# Patient Record
Sex: Female | Born: 1965 | Race: Black or African American | Hispanic: No | Smoking: Never smoker
Health system: Southern US, Community
[De-identification: ages and names within clinical notes are randomized; demographics above are authoritative.]

## PROBLEM LIST (undated history)

## (undated) DIAGNOSIS — I10 Essential (primary) hypertension: Secondary | ICD-10-CM

## (undated) DIAGNOSIS — M1990 Unspecified osteoarthritis, unspecified site: Secondary | ICD-10-CM

## (undated) DIAGNOSIS — M797 Fibromyalgia: Secondary | ICD-10-CM

---

## 2016-02-14 ENCOUNTER — Other Ambulatory Visit: Payer: Self-pay | Admitting: Occupational Medicine

## 2016-02-14 ENCOUNTER — Ambulatory Visit: Payer: Self-pay

## 2016-02-14 DIAGNOSIS — Z0000 Encounter for general adult medical examination without abnormal findings: Secondary | ICD-10-CM

## 2017-03-19 ENCOUNTER — Emergency Department (HOSPITAL_COMMUNITY)
Admission: EM | Admit: 2017-03-19 | Discharge: 2017-03-19 | Disposition: A | Discharge status: Home / Self Care | Payer: 59 | Attending: Emergency Medicine | Admitting: Emergency Medicine

## 2017-03-19 ENCOUNTER — Emergency Department (HOSPITAL_COMMUNITY): Payer: 59

## 2017-03-19 DIAGNOSIS — M79604 Pain in right leg: Secondary | ICD-10-CM

## 2017-03-19 DIAGNOSIS — M25551 Pain in right hip: Secondary | ICD-10-CM | POA: Diagnosis present

## 2017-03-19 DIAGNOSIS — M79661 Pain in right lower leg: Secondary | ICD-10-CM | POA: Insufficient documentation

## 2017-03-19 MED ORDER — DICLOFENAC SODIUM 1 % TD GEL: 2.0000 g | Freq: Four times a day (QID) | TRANSDERMAL | 0 refills | Status: AC

## 2017-03-19 MED ORDER — KETOROLAC TROMETHAMINE 30 MG/ML IJ SOLN
15.0000 mg | Freq: Once | INTRAMUSCULAR | Status: AC
  Administered 2017-03-19: 15 mg via INTRAMUSCULAR
  Filled 2017-03-19: qty 1

## 2017-03-19 NOTE — ED Provider Notes (Signed)
[facility]   CSN: [id number] Arrival date & time: 03/19/17  1246     History   Chief Complaint Chief Complaint  Patient presents with  . Leg Pain    HPI Latoya Payne is a 52 y.o. female.  Latoya Payne is a 52 y.o. Female with a history fibromyalgia and rheumatoid arthritis, who presents to the ED for evaluation of pain that starts in her right hip and radiates down through the right leg.  Patient reports for the most part the pain remains concentrated in her upper thigh around her hip that she occasionally gets sharp pains that shoot down her leg.  Patient denies any inciting injury.  No swelling, erythema or warmth.  She is more BV the ED patient patient denies any numbness, weakness or tingling.  Patient has been ambulatory although reports of been painful.  Patient reports this pain has been present for the past 2 weeks but was especially bad when she woke up this morning.  Patient denies any associated fevers or chills.  No associated back pain, abdominal pain, dysuria, chest pain or shortness of breath.  She takes oxycodone on a regular basis for fibromyalgia and this does not help this pain, patient has not tried any other medications or treatments for pain.  Movement makes pain worse no other aggravating factors.      No past medical history on file.  There are no active problems to display for this patient.    OB History    No data available       Home Medications    Prior to Admission medications   Medication Sig Start Date End Date Taking? Authorizing Provider  diclofenac sodium (VOLTAREN) 1 % GEL Apply 2 g topically 4 (four) times daily. 03/19/17   Dartha LodgeFord, Dunia Pringle N, PA-C    Family History No family history on file.  Social History Social History   Tobacco Use  . Smoking status: Not on file  Substance Use Topics  . Alcohol use: Not on file  . Drug use: Not on file     Allergies   Patient has no  known allergies.   Review of Systems Review of Systems  Constitutional: Negative for chills and fever.  Respiratory: Negative for shortness of breath.   Cardiovascular: Negative for chest pain.  Gastrointestinal: Negative for abdominal pain.  Genitourinary: Negative for dysuria.  Musculoskeletal: Positive for myalgias. Negative for arthralgias, back pain, gait problem and joint swelling.  Skin: Negative for color change, rash and wound.  Neurological: Negative for weakness and numbness.     Physical Exam Updated Vital Signs BP (!) 172/101 (BP Location: Right Arm)   Pulse 89   Temp 98.4 F (36.9 C) (Oral)   Resp 18   LMP 03/18/2017 (Exact Date)   SpO2 99%   Physical Exam  Constitutional: She appears well-developed and well-nourished. No distress.  HENT:  Head: Normocephalic and atraumatic.  Eyes: Right eye exhibits no discharge. Left eye exhibits no discharge.  Cardiovascular: Normal rate and regular rhythm.  Pulses:      Radial pulses are 2+ on the right side, and 2+ on the left side.       Dorsalis pedis pulses are 2+ on the right side, and 2+ on the left side.       Posterior tibial pulses are 2+ on the right side, and 2+ on the left side.  Pulmonary/Chest: Effort normal and breath sounds normal. No respiratory distress.  Abdominal:  Soft. Bowel sounds are normal. She exhibits no distension. There is no tenderness.  Musculoskeletal:  L-Spine nontender to palpation at midline or paraspinally, no tenderness over the lower back There is some tenderness to palpation over the left hip, no erythema, warmth, swelling or palpable deformity, tenderness is most pronounced over the lateral and anterior aspect mild tenderness through the upper leg, no tenderness at the knee, ankle or foot, no appreciable swelling DP and TP pulses 2+ with good capillary refill, sensation intact, 5/5 dorsi and plantar flexion, normal strength of proximal leg muscles  Neurological: She is alert.  Coordination normal.  Skin: Skin is warm and dry. Capillary refill takes less than 2 seconds. She is not diaphoretic.  Psychiatric: She has a normal mood and affect. Her behavior is normal.  Nursing note and vitals reviewed.    ED Treatments / Results  Labs (all labs ordered are listed, but only abnormal results are displayed) Labs Reviewed - No data to display  EKG  EKG Interpretation None       Radiology Dg Hip Unilat W Or Wo Pelvis 2-3 Views Right  Result Date: 03/19/2017 CLINICAL DATA:  Right hip pain.  No known injury. EXAM: DG HIP (WITH OR WITHOUT PELVIS) 2-3V RIGHT COMPARISON:  None. FINDINGS: There is no evidence of hip fracture or dislocation. There is no evidence of arthropathy or other focal bone abnormality. IMPRESSION: Negative. Electronically Signed   By: Signa Kell M.D.   On: 03/19/2017 15:03    Procedures Procedures (including critical care time)  Medications Ordered in ED Medications  ketorolac (TORADOL) 30 MG/ML injection 15 mg (15 mg Intramuscular Given 03/19/17 1439)     Initial Impression / Assessment and Plan / ED Course  I have reviewed the triage vital signs and the nursing notes.  Pertinent labs & imaging results that were available during my care of the patient were reviewed by me and considered in my medical decision making (see chart for details).  Pt with history of fibromyalgia and RA presents for evaluation of right leg pain, no swelling, deformity, erythema or warmth.  No fevers, back pain, abdominal pain or dysuria.  Patient is hypertensive but vitals otherwise normal, no neurologic exam right lower extremity is neurovascularly intact.  X-ray of right hip shows no acute bony abnormalities, normal joint spaces, no effusion.  Presentation suggestive of bursitis versus sciatica.  Patient already on chronic pain medication for her fibromyalgia, will treat with Tylenol, Voltaren gel and crutches.  If symptoms are not improving in the next week  patient follow-up with her PCP as well as orthopedics.  Strict return precautions discussed.  Patient expresses understanding and is in plan.  Final Clinical Impressions(s) / ED Diagnoses   Final diagnoses:  Pain of right lower extremity  Right hip pain    ED Discharge Orders        Ordered    diclofenac sodium (VOLTAREN) 1 % GEL  4 times daily     03/19/17 1533       Legrand Rams 03/19/17 Gillis Santa, MD 03/20/17 [phone]

## 2017-03-19 NOTE — ED Notes (Signed)
Pt called out for pain medication. EDP to be notified.

## 2017-03-19 NOTE — Discharge Instructions (Signed)
Your pain could be caused by bursitis or sciatica.  You may treat pain with Tylenol and diclofenac gel in addition to your home pain medications.  Can also try Salonpas, which is over the counter. you may use use crutches, encourage you to alternate ice and heat over the area to help with pain.  These follow-up with your primary doctor and if symptoms are not improving in a week follow-up with Dr. August Saucerean with orthopedics.  If you have weakness, numbness or tingling in the leg, or unable to walk or develop redness, swelling or severely increased pain please return to the ED for reevaluation.

## 2017-03-19 NOTE — ED Triage Notes (Signed)
Pt reports right side leg pain from hip into right leg. Pt denies any injury. No swelling. ambulatory in triage.

## 2017-03-26 ENCOUNTER — Emergency Department (HOSPITAL_BASED_OUTPATIENT_CLINIC_OR_DEPARTMENT_OTHER): Admit: 2017-03-26 | Discharge: 2017-03-26 | Disposition: A | Discharge status: Home / Self Care | Payer: 59

## 2017-03-26 ENCOUNTER — Encounter (HOSPITAL_COMMUNITY): Payer: Self-pay

## 2017-03-26 ENCOUNTER — Emergency Department (HOSPITAL_COMMUNITY)
Admission: EM | Admit: 2017-03-26 | Discharge: 2017-03-26 | Disposition: A | Discharge status: Home / Self Care | Payer: 59 | Attending: Emergency Medicine | Admitting: Emergency Medicine

## 2017-03-26 ENCOUNTER — Other Ambulatory Visit: Payer: Self-pay

## 2017-03-26 DIAGNOSIS — M79609 Pain in unspecified limb: Secondary | ICD-10-CM | POA: Diagnosis not present

## 2017-03-26 DIAGNOSIS — M79604 Pain in right leg: Secondary | ICD-10-CM | POA: Diagnosis present

## 2017-03-26 DIAGNOSIS — Z79899 Other long term (current) drug therapy: Secondary | ICD-10-CM | POA: Diagnosis not present

## 2017-03-26 DIAGNOSIS — I10 Essential (primary) hypertension: Secondary | ICD-10-CM | POA: Insufficient documentation

## 2017-03-26 HISTORY — DX: Essential (primary) hypertension: I10

## 2017-03-26 HISTORY — DX: Fibromyalgia: M79.7

## 2017-03-26 HISTORY — DX: Unspecified osteoarthritis, unspecified site: M19.90

## 2017-03-26 LAB — URINALYSIS, ROUTINE W REFLEX MICROSCOPIC
BILIRUBIN URINE: NEGATIVE
Glucose, UA: NEGATIVE mg/dL
HGB URINE DIPSTICK: NEGATIVE
KETONES UR: NEGATIVE mg/dL
Leukocytes, UA: NEGATIVE
NITRITE: NEGATIVE
PH: 6 (ref 5.0–8.0)
Protein, ur: NEGATIVE mg/dL
Specific Gravity, Urine: 1.019 (ref 1.005–1.030)

## 2017-03-26 MED ORDER — METHOCARBAMOL 500 MG PO TABS: 500.0000 mg | ORAL_TABLET | Freq: Two times a day (BID) | ORAL | 0 refills | Status: DC | PRN

## 2017-03-26 MED ORDER — CYCLOBENZAPRINE HCL 5 MG PO TABS: 5.0000 mg | ORAL_TABLET | Freq: Two times a day (BID) | ORAL | 0 refills | Status: DC | PRN

## 2017-03-26 MED ORDER — KETOROLAC TROMETHAMINE 15 MG/ML IJ SOLN
15.0000 mg | Freq: Once | INTRAMUSCULAR | Status: AC
  Administered 2017-03-26: 15 mg via INTRAMUSCULAR
  Filled 2017-03-26: qty 1

## 2017-03-26 NOTE — ED Triage Notes (Signed)
Patient seen last week for right leg pain. States that her xray was fine and treated for bursitis. States that her pain is ongoing. Alert and oriented, NAD

## 2017-03-26 NOTE — ED Notes (Signed)
EDP states okay for patient to drink - provided ginger ale so patient may provide urine sample for UA.

## 2017-03-26 NOTE — ED Notes (Signed)
Patient verbalized understanding of discharge instructions and denies any further needs or questions at this time. VS stable. Patient ambulatory with steady gait.  

## 2017-03-26 NOTE — ED Provider Notes (Signed)
[facility] Provider Note   CSN: [id number] Arrival date & time: 03/26/17  1536     History   Chief Complaint Chief Complaint  Patient presents with  . recheck leg pain    HPI Latoya Payne is a 52 y.o. female presenting for evaluation of R leg pain.   Pt states for the past several weeks she had gradual onset R leg pain. She was initially dx with bursitis. She followed up with ortho, who recommended she go to her rheumatologist, as this may be an arthritis flare. She reports pain is of her entire R upper leg, starting and her groin and extending just beyond her knee. It is sharp with movement, throbs when still. Hurts worse when she opens her leg. She has been using Voltaren gel with minimal relief of pain, oxycodone does not help. She is unable to take prednisone due to allergy. She denies fall, trauma, or injury. She denies back pain, numbness, tingling, or loss of bowel or bladder control. 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 She denies fevers, chills, CP, SOB, abd pain, N/V, urinary sxs, or abnormal BMs. 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 No recent travel, surgery, h/o cancer, or OCP use.   HPI  Past Medical History:  Diagnosis Date  . Arthritis   . Fibromyalgia   . Hypertension     There are no active problems to display for this patient.   History reviewed. No pertinent surgical history.  OB History    No data available       Home Medications    Prior to Admission medications   Medication Sig Start Date End Date Taking? Authorizing Provider  amLODipine (NORVASC) 10 MG tablet Take 10 mg by mouth daily. 03/20/17  Yes [provider]  diclofenac sodium (VOLTAREN) 1 % GEL Apply 2 g topically 4 (four) times daily. 03/19/17  Yes Dartha LodgeFord, Kelsey N, PA-C  gabapentin (NEURONTIN) 800 MG tablet Take 800 mg by mouth 3 (three) times daily. 03/21/17  Yes [provider]  meloxicam (MOBIC) 15 MG tablet Take 15 mg by mouth daily. 03/04/17  Yes [provider]  oxyCODONE-acetaminophen (PERCOCET) 10-325 MG tablet Take 1 tablet by mouth 5 (five) times daily as needed for pain. 03/26/17  Yes [provider]  methocarbamol (ROBAXIN) 500 MG tablet Take 1 tablet (500 mg total) by mouth 2 (two) times daily as needed for muscle spasms. 03/26/17   Katherinne Mofield, PA-C    Family History No family history on file.  Social History Social History   Tobacco Use  . Smoking status: Never Smoker  . Smokeless tobacco: Never Used  Substance Use Topics  . Alcohol use: Not on file  . Drug use: Not on file     Allergies   Prednisone; Singulair [montelukast]; Duloxetine; Flexeril [cyclobenzaprine]; and Lisinopril   Review of Systems Review of Systems  Musculoskeletal: Positive for arthralgias and myalgias.  All other systems reviewed and are negative.    Physical Exam Updated Vital Signs BP (!) 160/88 (BP Location: Right Arm)   Pulse 78   Temp 98.7 F (37.1 C) (Oral)   Resp 16   Ht 5\' 7"  (1.702 m)   Wt 80.7 kg (178 lb)   LMP 03/18/2017 (Exact Date)   SpO2 99%   BMI 27.88 kg/m   Physical Exam  Constitutional: She is oriented to person, place,  and time. She appears well-developed and well-nourished. No distress.  HENT:  Head: Normocephalic and atraumatic.  Eyes: Conjunctivae and EOM are normal. Pupils are equal, round, and reactive to light.  Neck: Normal range of motion. Neck supple.  Cardiovascular: Normal rate, regular rhythm and intact distal pulses.  Pulmonary/Chest: Effort normal and breath sounds normal. No respiratory distress. She has no wheezes.  Abdominal: Soft. She exhibits no distension and no mass. There is no tenderness. There is no guarding.  Musculoskeletal: She exhibits tenderness.  TTP of anterior R hip and R upper leg, anterior and posterior. No obvious swelling, redness, or warmth. TTP of R suprapubic area. No TTP  on L side. Soft compartments. Pt is ambulatory. Pedal and popliteal pulses intact bilaterally. Sensation intact bilaterally. No TTP of spine  Neurological: She is alert and oriented to person, place, and time. She has normal strength and normal reflexes. Coordination and gait normal.  Skin: Skin is warm and dry.  Psychiatric: She has a normal mood and affect.  Nursing note and vitals reviewed.    ED Treatments / Results  Labs (all labs ordered are listed, but only abnormal results are displayed) Labs Reviewed  URINALYSIS, ROUTINE W REFLEX MICROSCOPIC    EKG  EKG Interpretation None       Radiology No results found.  Procedures Procedures (including critical care time)  Medications Ordered in ED Medications  ketorolac (TORADOL) 15 MG/ML injection 15 mg (15 mg Intramuscular Given 03/26/17 1925)     Initial Impression / Assessment and Plan / ED Course  I have reviewed the triage vital signs and the nursing notes.  Pertinent labs & imaging results that were available during my care of the patient were reviewed by me and considered in my medical decision making (see chart for details).     Pt presenting for evaluation of right leg pain.  Physical exam reassuring, she is neurovascularly intact.  As patient has history of DVT, will obtain ultrasound to ensure leg pain is not due to DVT.  Will obtain UA, as patient has right-sided suprapubic tenderness.  Patient given ketorolac shot for pain.  Ultrasound negative for DVT.  Urine negative for infection or blood.  Doubt abdominal etiology, as pain extends into the leg.  On reassessment, pain is improved with ketorolac.  Blood pressure improved with ketorolac.  I believe hypertension may be related to pain.  Likely MSK.  As patient has had recent x-rays with orthopedics of the hip and knee, I will not repeat those today.  No sign of infection.  Will start patient on anti-inflammatories and muscle relaxer.  Patient to follow-up with  primary care or rheumatology for further evaluation.  At this time, patient appears safe for discharge.  Return precautions given.  Patient states she understands and agrees to plan.   Final Clinical Impressions(s) / ED Diagnoses   Final diagnoses:  Right leg pain    ED Discharge Orders        Ordered    cyclobenzaprine (FLEXERIL) 5 MG tablet  2 times daily PRN,   Status:  Discontinued     03/26/17 2115    methocarbamol (ROBAXIN) 500 MG tablet  2 times daily PRN     03/26/17 2128       Maximino Cozzolino, PA-C 03/27/17 0113    Alveria Apley, PA-C 03/27/17 0114    Linwood Dibbles, MD 03/27/17 [phone]

## 2017-03-26 NOTE — Progress Notes (Signed)
RLE venous duplex prelim: negative for DVT.  Telicia Hodgkiss Eunice, RDMS, RVT  

## 2017-03-26 NOTE — Discharge Instructions (Signed)
Take aleve 2 times a day with meals.  Do not take other anti-inflammatories at the same time open (Advil, Motrin, ibuprofen, Aleve).  Continue taking your at home medications including oxycodone and the voltaren gel.  Use the muscle relaxer for pain.  Use ice packs or heating pads if this helps control your pain. Follow up with your primary care or rheumatoid doctor for further evaluation.  Return to the ER if you develop fevers, persistent numbness, color change of your leg, or any new, worsening, or concerning symptoms.

## 2017-03-26 NOTE — ED Notes (Signed)
Vascular at bedside completing U/S.

## 2017-03-26 NOTE — ED Notes (Signed)
Patient states she can't take Flexeril because it makes her irritable. Requesting a different muscle relaxer. EDP notified.

## 2018-03-07 ENCOUNTER — Emergency Department (HOSPITAL_COMMUNITY): Payer: BLUE CROSS/BLUE SHIELD

## 2018-03-07 ENCOUNTER — Other Ambulatory Visit: Payer: Self-pay

## 2018-03-07 ENCOUNTER — Emergency Department (HOSPITAL_COMMUNITY)
Admission: EM | Admit: 2018-03-07 | Discharge: 2018-03-07 | Disposition: A | Discharge status: Home / Self Care | Payer: BLUE CROSS/BLUE SHIELD | Attending: Emergency Medicine | Admitting: Emergency Medicine

## 2018-03-07 ENCOUNTER — Encounter (HOSPITAL_COMMUNITY): Payer: Self-pay | Admitting: Emergency Medicine

## 2018-03-07 DIAGNOSIS — R300 Dysuria: Secondary | ICD-10-CM | POA: Insufficient documentation

## 2018-03-07 DIAGNOSIS — M545 Low back pain, unspecified: Secondary | ICD-10-CM

## 2018-03-07 DIAGNOSIS — R109 Unspecified abdominal pain: Secondary | ICD-10-CM | POA: Diagnosis not present

## 2018-03-07 DIAGNOSIS — R319 Hematuria, unspecified: Secondary | ICD-10-CM | POA: Diagnosis not present

## 2018-03-07 DIAGNOSIS — R1030 Lower abdominal pain, unspecified: Secondary | ICD-10-CM | POA: Diagnosis not present

## 2018-03-07 DIAGNOSIS — Z79899 Other long term (current) drug therapy: Secondary | ICD-10-CM | POA: Insufficient documentation

## 2018-03-07 DIAGNOSIS — I10 Essential (primary) hypertension: Secondary | ICD-10-CM | POA: Insufficient documentation

## 2018-03-07 DIAGNOSIS — R1084 Generalized abdominal pain: Secondary | ICD-10-CM | POA: Diagnosis not present

## 2018-03-07 LAB — CBC WITH DIFFERENTIAL/PLATELET
Abs Immature Granulocytes: 0.01 10*3/uL (ref 0.00–0.07)
Basophils Absolute: 0.1 10*3/uL (ref 0.0–0.1)
Basophils Relative: 1 %
EOS PCT: 1 %
Eosinophils Absolute: 0.1 10*3/uL (ref 0.0–0.5)
HEMATOCRIT: 33.9 % — AB (ref 36.0–46.0)
HEMOGLOBIN: 10.6 g/dL — AB (ref 12.0–15.0)
Immature Granulocytes: 0 %
LYMPHS ABS: 3.2 10*3/uL (ref 0.7–4.0)
LYMPHS PCT: 35 %
MCH: 29 pg (ref 26.0–34.0)
MCHC: 31.3 g/dL (ref 30.0–36.0)
MCV: 92.9 fL (ref 80.0–100.0)
MONO ABS: 0.6 10*3/uL (ref 0.1–1.0)
Monocytes Relative: 7 %
Neutro Abs: 5.3 10*3/uL (ref 1.7–7.7)
Neutrophils Relative %: 56 %
Platelets: 320 10*3/uL (ref 150–400)
RBC: 3.65 MIL/uL — ABNORMAL LOW (ref 3.87–5.11)
RDW: 12.9 % (ref 11.5–15.5)
WBC: 9.3 10*3/uL (ref 4.0–10.5)
nRBC: 0 % (ref 0.0–0.2)

## 2018-03-07 LAB — COMPREHENSIVE METABOLIC PANEL
ALBUMIN: 4.2 g/dL (ref 3.5–5.0)
ALT: 15 U/L (ref 0–44)
AST: 19 U/L (ref 15–41)
Alkaline Phosphatase: 63 U/L (ref 38–126)
Anion gap: 9 (ref 5–15)
BUN: 15 mg/dL (ref 6–20)
CALCIUM: 9.1 mg/dL (ref 8.9–10.3)
CO2: 27 mmol/L (ref 22–32)
CREATININE: 0.62 mg/dL (ref 0.44–1.00)
Chloride: 105 mmol/L (ref 98–111)
GFR calc Af Amer: >60 mL/min (ref >60)
GFR calc non Af Amer: >60 mL/min (ref >60)
GLUCOSE: 102 mg/dL — AB (ref 70–99)
Potassium: 3.4 mmol/L — ABNORMAL LOW (ref 3.5–5.1)
Sodium: 141 mmol/L (ref 135–145)
Total Bilirubin: 0.3 mg/dL (ref 0.3–1.2)
Total Protein: 7.5 g/dL (ref 6.5–8.1)

## 2018-03-07 LAB — WET PREP, GENITAL
Clue Cells Wet Prep HPF POC: NONE SEEN
SPERM: NONE SEEN
Trich, Wet Prep: NONE SEEN
WBC, Wet Prep HPF POC: NONE SEEN
Yeast Wet Prep HPF POC: NONE SEEN

## 2018-03-07 LAB — URINALYSIS, ROUTINE W REFLEX MICROSCOPIC
BILIRUBIN URINE: NEGATIVE
GLUCOSE, UA: NEGATIVE mg/dL
KETONES UR: NEGATIVE mg/dL
LEUKOCYTES UA: NEGATIVE
Nitrite: NEGATIVE
PH: 8 (ref 5.0–8.0)
Protein, ur: NEGATIVE mg/dL
Specific Gravity, Urine: 1.015 (ref 1.005–1.030)

## 2018-03-07 LAB — LIPASE, BLOOD: Lipase: 34 U/L (ref 11–51)

## 2018-03-07 LAB — PREGNANCY, URINE: Preg Test, Ur: NEGATIVE

## 2018-03-07 MED ORDER — KETOROLAC TROMETHAMINE 15 MG/ML IJ SOLN: 15.0000 mg | Freq: Once | INTRAMUSCULAR | Status: DC

## 2018-03-07 MED ORDER — KETOROLAC TROMETHAMINE 15 MG/ML IJ SOLN
15.0000 mg | Freq: Once | INTRAMUSCULAR | Status: AC
  Administered 2018-03-07: 15 mg via INTRAVENOUS
  Filled 2018-03-07: qty 1

## 2018-03-07 MED ORDER — SODIUM CHLORIDE 0.9 % IV BOLUS
500.0000 mL | Freq: Once | INTRAVENOUS | Status: AC
  Administered 2018-03-07: 500 mL via INTRAVENOUS

## 2018-03-07 MED ORDER — METHOCARBAMOL 500 MG PO TABS: 500.0000 mg | ORAL_TABLET | Freq: Two times a day (BID) | ORAL | 0 refills | Status: AC

## 2018-03-07 MED ORDER — METHOCARBAMOL 500 MG PO TABS: 500.0000 mg | ORAL_TABLET | Freq: Two times a day (BID) | ORAL | 0 refills | Status: DC

## 2018-03-07 NOTE — ED Notes (Signed)
Per PCP-states lower back pain, hematuria-sending to ED for eval-possible kidney stones

## 2018-03-07 NOTE — ED Triage Notes (Signed)
Patient sent from [facility].  Reports bilateral flank pain x 1 week.  Additionally endorses nausea, hematuria, and lower abdominal pain.

## 2018-03-07 NOTE — ED Provider Notes (Addendum)
[facility]-EMERGENCY DEPT Provider Note   CSN: [id number] Arrival date & time: 03/07/18  1451     History   Chief Complaint Chief Complaint  Patient presents with  . Flank Pain    HPI Latoya Payne is a 53 y.o. female with history of hypertension and fibromyalgia sent in from [facility] for bilateral flank pain, nausea, dysuria and lower abdominal pain.  Patient reports that bilateral lower back pain began 3 weeks ago has been gradually worsening since onset.  She has been using her home oxycodone with minimal relief of pain.  She describes her pain as a moderate intensity, constant, worsened with movement without alleviating factors.  Patient endorses blood in her urine and mild burning pain with urination that began 2 weeks ago.  Patient endorses intermittent nausea for the past 2 weeks as well.  HPI  Past Medical History:  Diagnosis Date  . Arthritis   . Fibromyalgia   . Hypertension     There are no active problems to display for this patient.   History reviewed. No pertinent surgical history.   OB History   No obstetric history on file.      Home Medications    Prior to Admission medications   Medication Sig Start Date End Date Taking? Authorizing Provider  amLODipine (NORVASC) 10 MG tablet Take 10 mg by mouth daily. 03/20/17  Yes [provider]  gabapentin (NEURONTIN) 800 MG tablet Take 800 mg by mouth 3 (three) times daily. 03/21/17  Yes [provider]  meloxicam (MOBIC) 15 MG tablet Take 15 mg by mouth daily. 03/04/17  Yes [provider]  oxyCODONE-acetaminophen (PERCOCET) 10-325 MG tablet Take 1 tablet by mouth 3 (three) times daily as needed for pain.  03/26/17  Yes [provider]  diclofenac sodium (VOLTAREN) 1 % GEL Apply 2 g topically 4 (four) times daily. Patient not taking: Reported on 03/07/2018 03/19/17   Dartha Lodge, PA-C  methocarbamol (ROBAXIN) 500 MG tablet Take 1  tablet (500 mg total) by mouth 2 (two) times daily. 03/07/18   Bill Salinas, PA-C    Family History History reviewed. No pertinent family history.  Social History Social History   Tobacco Use  . Smoking status: Never Smoker  . Smokeless tobacco: Never Used  Substance Use Topics  . Alcohol use: Not on file  . Drug use: Not on file     Allergies   Prednisone; Singulair [montelukast]; Duloxetine; Flexeril [cyclobenzaprine]; and Lisinopril   Review of Systems Review of Systems  Constitutional: Negative.  Negative for chills and fever.  Respiratory: Negative.  Negative for cough and shortness of breath.   Cardiovascular: Negative.  Negative for chest pain.  Gastrointestinal: Positive for abdominal pain and nausea. Negative for blood in stool, diarrhea and vomiting.  Genitourinary: Positive for dysuria, flank pain and hematuria. Negative for vaginal bleeding and vaginal discharge.  Musculoskeletal: Negative for arthralgias and myalgias.  Neurological: Negative.  Negative for dizziness, weakness and headaches.  All other systems reviewed and are negative.  Physical Exam Updated Vital Signs BP (!) 143/89 (BP Location: Left Arm)   Pulse 79   Temp 98.4 F (36.9 C) (Oral)   Resp 14   Ht 5\' 7"  (1.702 m)   Wt 83.9 kg   LMP 03/06/2018 (Exact Date)   SpO2 100%   BMI 28.98 kg/m   Physical Exam Constitutional:      General: She is not in acute distress.    Appearance: Normal  appearance. She is well-developed. She is not ill-appearing or diaphoretic.  HENT:     Head: Normocephalic and atraumatic.     Right Ear: External ear normal.     Left Ear: External ear normal.     Nose: Nose normal.     Mouth/Throat:     Mouth: Mucous membranes are moist.     Pharynx: Oropharynx is clear.  Eyes:     Extraocular Movements: Extraocular movements intact.     Conjunctiva/sclera: Conjunctivae normal.     Pupils: Pupils are equal, round, and reactive to light.  Neck:      Musculoskeletal: Normal range of motion.     Trachea: Trachea normal. No tracheal deviation.  Cardiovascular:     Rate and Rhythm: Normal rate and regular rhythm.     Pulses: Normal pulses.          Dorsalis pedis pulses are 2+ on the right side and 2+ on the left side.       Posterior tibial pulses are 2+ on the right side and 2+ on the left side.     Heart sounds: Normal heart sounds.  Pulmonary:     Effort: Pulmonary effort is normal. No respiratory distress.     Breath sounds: Normal breath sounds.  Abdominal:     Palpations: Abdomen is soft.     Tenderness: There is abdominal tenderness in the suprapubic area. There is no guarding or rebound.     Comments: Mild suprapubic abdominal tenderness to palpation.  Genitourinary:    Comments: Pelvic Exam Chaperoned by [facility] RN.  Pelvic exam: normal external genitalia without evidence of trauma. VULVA: normal appearing vulva with no masses, tenderness or lesion. VAGINA: normal appearing vagina with normal color and discharge, no lesions. CERVIX: normal appearing cervix without lesions, cervical motion tenderness absent, cervical os closed without purulent discharge;blood present from menstruation. Wet prep and DNA probe for chlamydia and GC obtained.  ADNEXA: normal adnexa in size, nontender and no masses UTERUS: uterus is normal size, shape, consistency and nontender.  Musculoskeletal: Normal range of motion.       Back:     Right lower leg: Normal.     Left lower leg: Normal.     Comments: Patient with mild tenderness to light palpation of the lower back bilaterally.  No midline C/T/L spinal tenderness to palpation, no deformity, crepitus, or step-off noted. No sign of injury to the neck or back.  Feet:     Right foot:     Protective Sensation: 3 sites tested. 3 sites sensed.     Left foot:     Protective Sensation: 3 sites tested. 3 sites sensed.  Skin:    General: Skin is warm and dry.  Neurological:     Mental Status: She is  alert and oriented to person, place, and time.     GCS: GCS eye subscore is 4. GCS verbal subscore is 5. GCS motor subscore is 6.     Comments: Mental Status: Alert, oriented, thought content appropriate, able to give a coherent history. Speech fluent without evidence of aphasia. Able to follow 2 step commands without difficulty. Cranial Nerves: II: Peripheral visual fields grossly normal, pupils equal, round, reactive to light III,IV, VI: ptosis not present, extra-ocular motions intact bilaterally V,VII: smile symmetric, eyebrows raise symmetric, facial light touch sensation equal VIII: hearing grossly normal to voice X: uvula elevates symmetrically XI: bilateral shoulder shrug symmetric and strong XII: midline tongue extension without fassiculations Motor: Normal tone. 5/5 strength  in upper and lower extremities bilaterally including strong and equal grip strength and dorsiflexion/plantar flexion Sensory: Sensation intact to light touch in all extremities.Negative Romberg.  Cerebellar: normal finger-to-nose with bilateral upper extremities. Normal heel-to -shin balance bilaterally of the lower extremity. No pronator drift.  Gait: normal gait and balance CV: distal pulses palpable throughout  Psychiatric:        Behavior: Behavior normal.    ED Treatments / Results  Labs (all labs ordered are listed, but only abnormal results are displayed) Labs Reviewed  URINALYSIS, ROUTINE W REFLEX MICROSCOPIC - Abnormal; Notable for the following components:      Result Value   APPearance CLOUDY (*)    Hgb urine dipstick LARGE (*)    RBC / HPF >50 (*)    Bacteria, UA RARE (*)    All other components within normal limits  CBC WITH DIFFERENTIAL/PLATELET - Abnormal; Notable for the following components:   RBC 3.65 (*)    Hemoglobin 10.6 (*)    HCT 33.9 (*)    All other components within normal limits  COMPREHENSIVE METABOLIC PANEL - Abnormal; Notable for the following components:    Potassium 3.4 (*)    Glucose, Bld 102 (*)    All other components within normal limits  WET PREP, GENITAL  PREGNANCY, URINE  LIPASE, BLOOD  RPR  HIV ANTIBODY (ROUTINE TESTING W REFLEX)  GC/CHLAMYDIA PROBE AMP ([place]) NOT AT [facility]    EKG None  Radiology Ct Renal Stone Study  Result Date: 03/07/2018 CLINICAL DATA:  bilateral flank pain x 1 week. Additionally endorses nausea, hematuria, and lower abdominal pain. EXAM: CT ABDOMEN AND PELVIS WITHOUT CONTRAST TECHNIQUE: Multidetector CT imaging of the abdomen and pelvis was performed following the standard protocol without IV contrast. COMPARISON:  None. FINDINGS: Lower chest: No acute abnormality. Hepatobiliary: No focal liver abnormality is seen. No gallstones, gallbladder wall thickening, or biliary dilatation. Pancreas: Unremarkable. No pancreatic ductal dilatation or surrounding inflammatory changes. Spleen: Normal in size without focal abnormality. Adrenals/Urinary Tract: Adrenal glands are unremarkable. Kidneys are normal, without renal calculi, focal lesion, or hydronephrosis. Bladder is unremarkable. Stomach/Bowel: Stomach is within normal limits. Appendix appears normal. No evidence of bowel wall thickening, distention, or inflammatory changes. Vascular/Lymphatic: Minimal partially calcified atheromatous plaque in the infrarenal aorta without aneurysm. No abdominal or pelvic adenopathy localized. Reproductive: Uterus and bilateral adnexa are unremarkable. Other: Bilateral pelvic phleboliths. No free air. No ascites. Musculoskeletal: Small umbilical hernia containing only mesenteric fat. Degenerative disc disease L4-5 and L5-S1. 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 Negative for fracture or worrisome bone lesion. IMPRESSION: No acute findings. Electronically Signed   By: Corlis Leak  Hassell M.D.   On: 03/07/2018 17:40    Procedures Procedures (including critical care time)  Medications Ordered in ED Medications  sodium chloride 0.9 % bolus 500 mL (0  mLs Intravenous Stopped 03/07/18 1728)  ketorolac (TORADOL) 15 MG/ML injection 15 mg (15 mg Intravenous Given 03/07/18 1651)     Initial Impression / Assessment and Plan / ED Course  I have reviewed the triage vital signs and the nursing notes.  Pertinent labs & imaging results that were available during my care of the patient were reviewed by me and considered in my medical decision making (see chart for details).    [id number] PM: 53 year old female with history of hypertension fibromyalgia presenting today for 3-week history of flank pain, 2-week history of dysuria and intermittent nausea.  Sent in by urgent care for evaluation of kidney stones. ------------------- Patient diffusely tender  to the lower back with light palpation.  Mild suprapubic abdominal tenderness to palpation, nonacute abdomen. Fluid bolus given, patient denying history of stomach ulcers or kidney disease, pain treated with 15 mg of Toradol. ------------------- CBC nonacute CMP nonacute Urine pregnancy negative Urinalysis with red blood cells present, patient currently on menstrual cycle Lipase within normal limits CT renal stone study without acute findings ---------------- HIV/RPR sent GC chlamydia sent Wet prep pending ------------ Informed wet prep needs to be re-collected, patient to collect herself.  Patient endorses improvement of pain following Toradol. ---------------- Wet prep without abnormality ---------------- Suspect musculoskeletal etiology of patient's bilateral lower back pain.  No neurological deficits and normal neuro exam. Patient denies loss of bowel/bladder control or saddle area paresthesias.  No concern for cauda equina.  No fever, night sweats, weight loss, h/o cancer, or IVDU.  RICE protocol and pain medicine indicated and discussed with patient.   Pain improved following Toradol, denies history of CKD or gastric bleeding. Robaxin 500mg  BID prescribed. Patient informed to avoid driving or  operating heavy machinery while taking muscle relaxer.  Patient is afebrile, non-toxic appearing, sitting comfortably on examination table. Patient's pain and other symptoms adequately managed in emergency department. Labs, imaging and vitals reviewed.  Patient does not meet the SIRS or Sepsis criteria.  On repeat exam patient does not have a surgical abdomen and there are no peritoneal signs.  No indication of appendicitis, bowel obstruction, bowel perforation, cholecystitis, diverticulitis, PID or ectopic pregnancy.  Patient states understanding that she has GC/chlamydia, HIV and syphilis testing pending.  Patient denies new sexual contacts or concern for STDs, I have started encourage patient to practice safe sex and have all partners tested and treated.  Patient wishes to wait on antibiotic treatment until testing has returned. ----------------------------------- After discharge patient reports headache.  She describes her headache as a mild intensity bilateral aching sensation that is constant and worsened with shaking her head.  Patient states that she believes she has this headache from not sleeping, she states that she works third shift and has not slept all day and believes that going home and resting would help her headache.  I have offered patient medication for headache here in the emergency department she is refused.  There are no neurologic deficits on examination today.  Patient states that she has headaches similar in the past and this headache is not abnormal for her.  The patient denies any neurologic symptoms such as visual changes, focal numbness/weakness, balance problems, confusion, or speech difficulty to suggest a life-threatening intracranial process such as intracranial hemorrhage or mass. Doubt sinus thrombosis. No fevers, neck pain or nuchal rigidity to suggest meningitis. Patient is afebrile, non-toxic and well appearing. Reassuring neuro exam, normal gait to the hall and back. No  cranial deficits, no speech deficits, negative pronator drift, normal/equal strength to all extremities.   PCP follow up strongly encouraged. I have reviewed return precautions including development of fever, nausea/vomiting or neurologic symptoms, vision changes, confusion, lethargy, difficulty speaking/walking, or other new/worsening/concerning symptoms.   At this time there does not appear to be any evidence of an acute emergency medical condition and the patient appears stable for discharge with appropriate outpatient follow up. Diagnosis was discussed with patient who verbalizes understanding of care plan and is agreeable to discharge. I have discussed return precautions with patient who verbalizes understanding of return precautions. Patient strongly encouraged to follow-up with their PCP this week. All questions answered.  Patient's case discussed with Dr. Rush Landmark who  agrees with plan to discharge with follow-up.   Note: Portions of this report may have been transcribed using voice recognition software. Every effort was made to ensure accuracy; however, inadvertent computerized transcription errors may still be present. Final Clinical Impressions(s) / ED Diagnoses   Final diagnoses:  Bilateral low back pain without sciatica, unspecified chronicity  Dysuria    ED Discharge Orders         Ordered    methocarbamol (ROBAXIN) 500 MG tablet  2 times daily,   Status:  Discontinued     03/07/18 2139    methocarbamol (ROBAXIN) 500 MG tablet  2 times daily     03/07/18 2139           Bill Salinas, PA-C 03/08/18 0017    Bill Salinas, PA-C 03/08/18 0018    Tegeler, Canary Brim, MD 03/08/18 1046

## 2018-03-07 NOTE — ED Notes (Signed)
Patient transported to CT 

## 2018-03-07 NOTE — Discharge Instructions (Addendum)
You have been diagnosed today with lower back pain and dysuria.   At this time there does not appear to be the presence of an emergent medical condition, however there is always the potential for conditions to change. Please read and follow the below instructions.  Please return to the Emergency Department immediately for any new or worsening symptoms. Please be sure to follow up with your Primary Care Provider this week regarding your visit today; please call their office to schedule an appointment even if you are feeling better for a follow-up visit. Your CT scan today showed degenerative disc disease, degenerative joint disease of the hips, atherosclerosis and small umbilical hernia.  Please discuss these findings with your primary care doctor at your next visit.  I have attached the study below that you can discuss it in more detail with your primary care provider. You may use the muscle relaxer Robaxin as prescribed to help with your back pain.  Please do not drive or operate heavy machinery will take this medication because it will make you drowsy.  Do not take this medication with other sedating medications. Your STD tests, gonorrhea/chlamydia, HIV/syphilis are pending at this time.  You will be contacted by [facility] for positive results only within 4 days.  You may check your MyChart account for your results.  For positive results please be sure that all of your partners are tested and treated.  Please abstain from sexual intercourse until everyone has been treated and his symptoms free.  Get help right away if: You develop new bowel or bladder control problems. You have unusual weakness or numbness in your arms or legs. You develop nausea or vomiting. You develop abdominal pain. You feel faint. You have a fever. You develop pain in your back or sides. You have nausea or vomiting. You have blood in your urine. You are not urinating as often as you usually do. Get help right away if: Your  pain is severe and not relieved with medicines. You cannot eat or drink without vomiting. You are confused. You have a rapid heartbeat while at rest. You have shaking or chills. You feel extremely weak.  Please read the additional information packets attached to your discharge summary.  Do not take your medicine if  develop an itchy rash, swelling in your mouth or lips, or difficulty breathing.   CT renal stone study:  FINDINGS: Lower chest: No acute abnormality. Hepatobiliary: No focal liver abnormality is seen. No gallstones, gallbladder wall thickening, or biliary dilatation. Pancreas: Unremarkable. No pancreatic ductal dilatation or surrounding inflammatory changes. Spleen: Normal in size without focal abnormality. Adrenals/Urinary Tract: Adrenal glands are unremarkable. Kidneys are normal, without renal calculi, focal lesion, or hydronephrosis. Bladder is unremarkable. Stomach/Bowel: Stomach is within normal limits. Appendix appears normal. No evidence of bowel wall thickening, distention, or inflammatory changes. Vascular/Lymphatic: Minimal partially calcified atheromatous plaque in the infrarenal aorta without aneurysm. No abdominal or pelvic adenopathy localized. Reproductive: Uterus and bilateral adnexa are unremarkable. Other: Bilateral pelvic phleboliths. No free air. No ascites. Musculoskeletal: Small umbilical hernia containing only mesenteric fat. Degenerative disc disease L4-5 and L5-S1. 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 Negative for fracture or worrisome bone lesion. IMPRESSION: No acute findings.

## 2018-03-08 LAB — RPR: RPR Ser Ql: NONREACTIVE

## 2018-03-08 LAB — HIV ANTIBODY (ROUTINE TESTING W REFLEX): HIV Screen 4th Generation wRfx: NONREACTIVE

## 2018-03-10 LAB — GC/CHLAMYDIA PROBE AMP (~~LOC~~) NOT AT ARMC
CHLAMYDIA, DNA PROBE: NEGATIVE
NEISSERIA GONORRHEA: NEGATIVE

## 2019-07-28 DEATH — deceased

## 2019-12-14 IMAGING — CT CT RENAL STONE PROTOCOL
2 of 4 series · 16 of 46 positions shown, 18 images · non-contrast
Comparison: None.

CLINICAL DATA: bilateral flank pain x 1 week. Additionally endorses
nausea, hematuria, and lower abdominal pain.

EXAM:
CT ABDOMEN AND PELVIS WITHOUT CONTRAST
TECHNIQUE: Multidetector CT imaging of the abdomen and pelvis was performed
following the standard protocol without IV contrast.

[Series 2: axial st · axial · 0.75mm/px · z∈[-459,-59]mm · 13 of 92 slices shown, 15 images]
[im 6/92  soft-tissue]
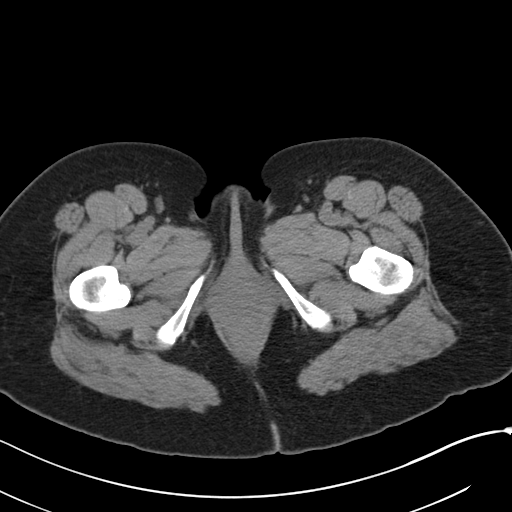
[im 6/92  bone]
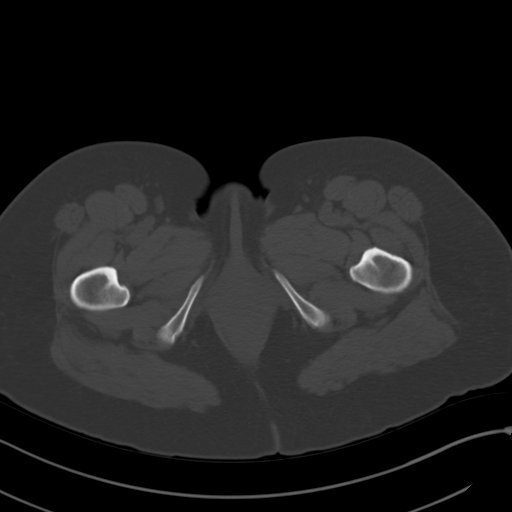
[im 11/92  soft-tissue]
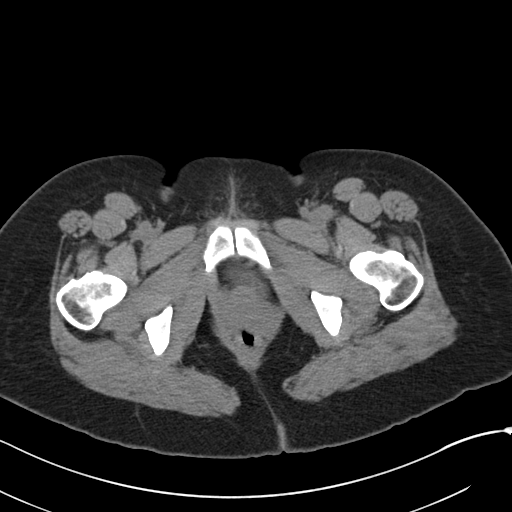
[im 22/92  soft-tissue]
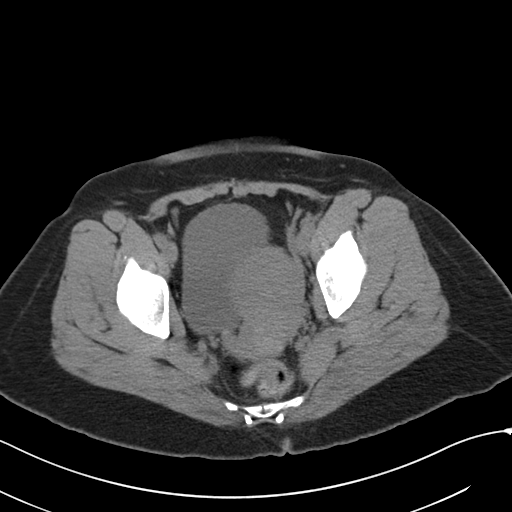
[im 27/92  soft-tissue]
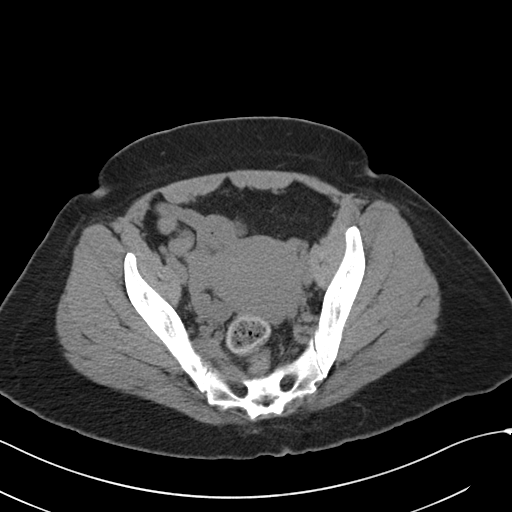
[im 33/92  soft-tissue]
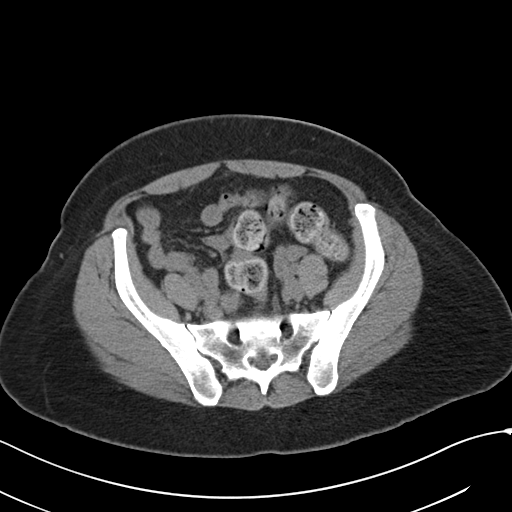
[im 38/92  soft-tissue]
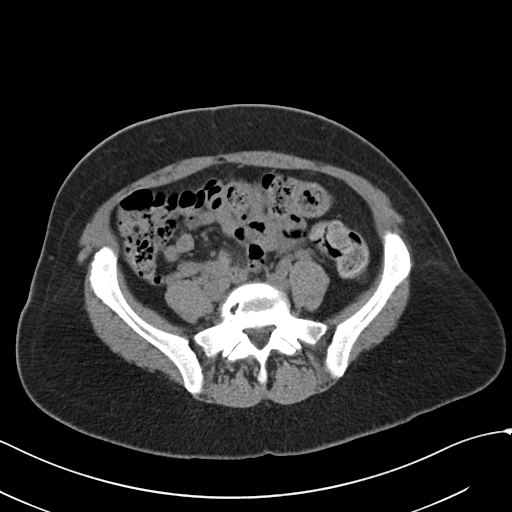
[im 49/92  soft-tissue]
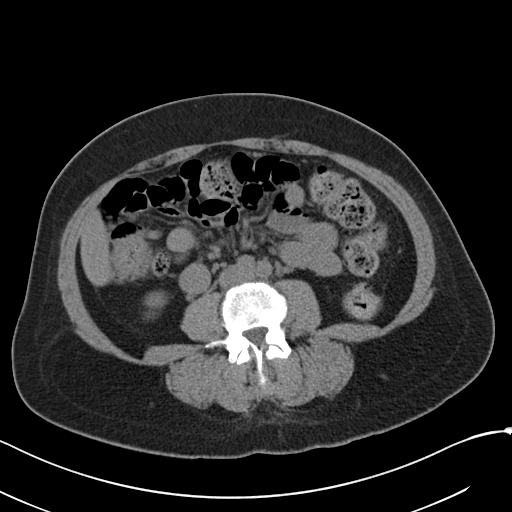
[im 54/92  soft-tissue]
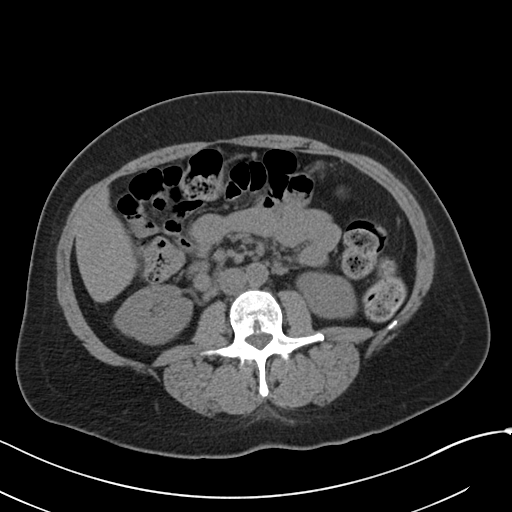
[im 59/92  soft-tissue]
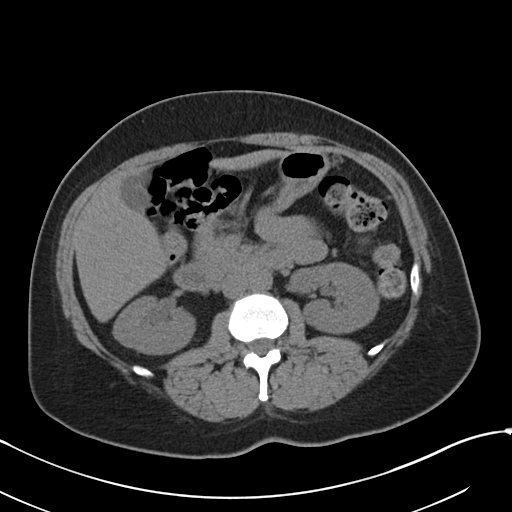
[im 59/92  bone]
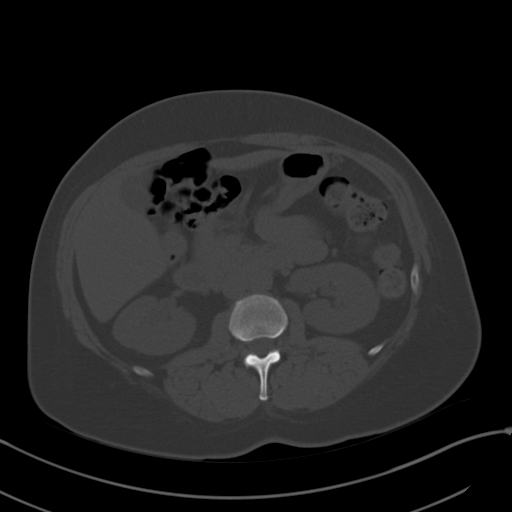
[im 65/92  soft-tissue]
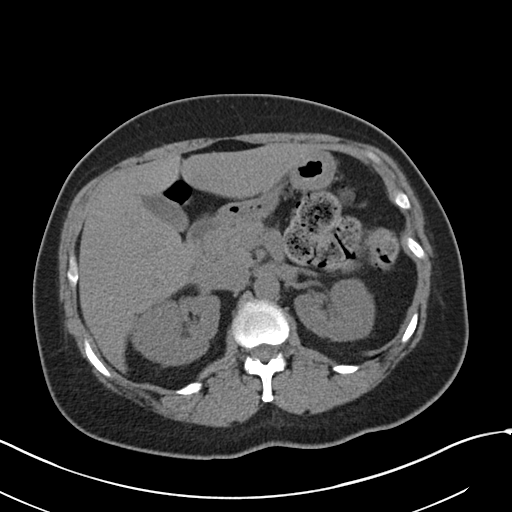
[im 70/92  soft-tissue]
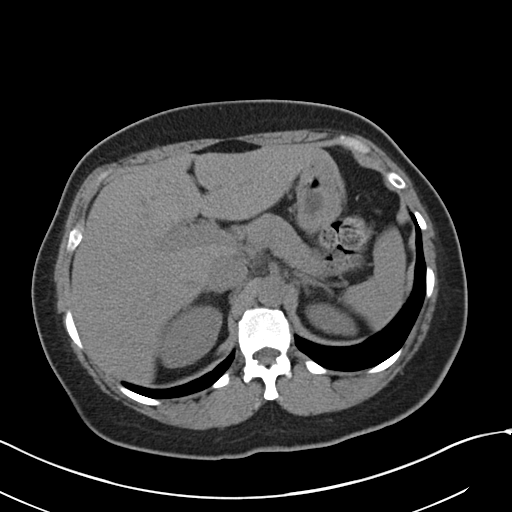
[im 81/92  soft-tissue]
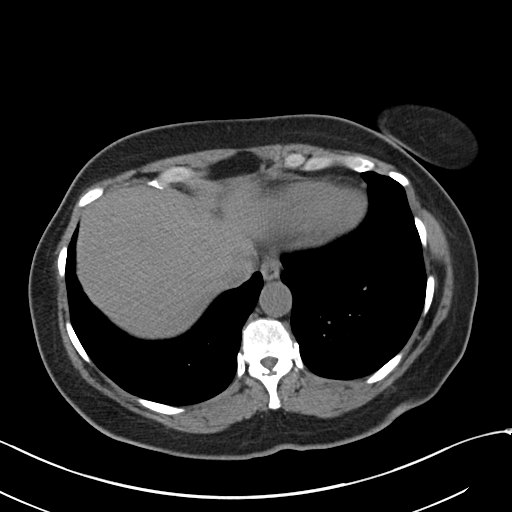
[im 86/92  soft-tissue]
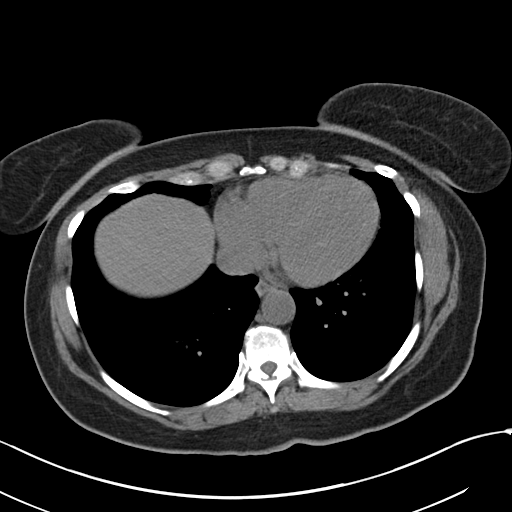

[Series 5: coronal · coronal · 0.72mm/px · 3 of 140 slices shown]
[im 47/140  soft-tissue]
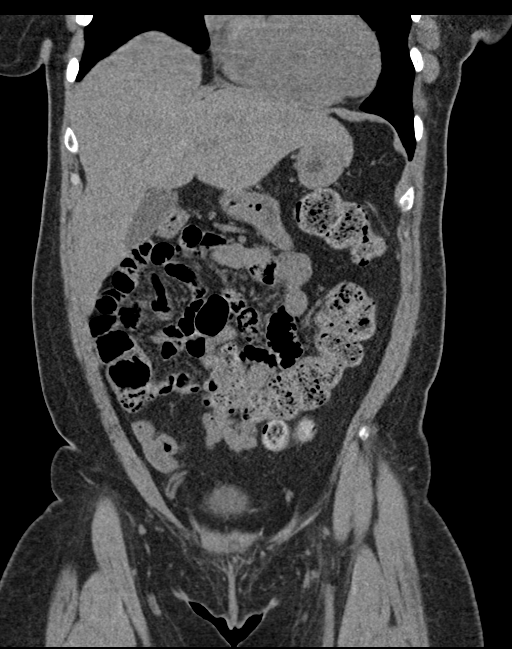
[im 62/140  soft-tissue]
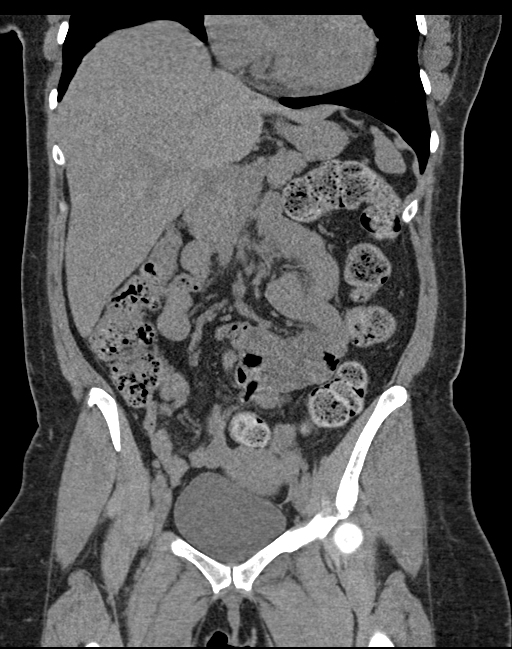
[im 78/140  soft-tissue]
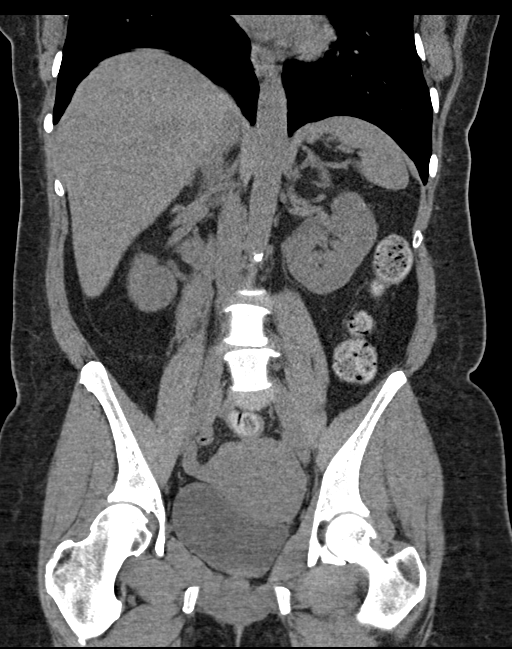

[16 of 46 positions shown; findings below may reference images not displayed]

FINDINGS: Lower chest: No acute abnormality.

Hepatobiliary: No focal liver abnormality is seen. No gallstones,
gallbladder wall thickening, or biliary dilatation.

Pancreas: Unremarkable. No pancreatic ductal dilatation or
surrounding inflammatory changes.

Spleen: Normal in size without focal abnormality.

Adrenals/Urinary Tract: Adrenal glands are unremarkable. Kidneys are
normal, without renal calculi, focal lesion, or hydronephrosis.
Bladder is unremarkable.

Stomach/Bowel: Stomach is within normal limits. Appendix appears
normal. No evidence of bowel wall thickening, distention, or
inflammatory changes.

Vascular/Lymphatic: Minimal partially calcified atheromatous plaque
in the infrarenal aorta without aneurysm. No abdominal or pelvic
adenopathy localized.

Reproductive: Uterus and bilateral adnexa are unremarkable.

Other: Bilateral pelvic phleboliths. No free air. No ascites.

Musculoskeletal: Small umbilical hernia containing only mesenteric
fat. Degenerative disc disease L4-5 and L5-S1. Early DJD in
bilateral hips. Negative for fracture or worrisome bone lesion.
IMPRESSION: No acute findings.
